# Patient Record
Sex: Female | Born: 1980 | Race: White | Hispanic: No | Marital: Married | Smoking: Former smoker
Health system: Southern US, Community
[De-identification: ages and names within clinical notes are randomized; demographics above are authoritative.]

## PROBLEM LIST (undated history)

## (undated) DIAGNOSIS — I10 Essential (primary) hypertension: Secondary | ICD-10-CM

## (undated) HISTORY — DX: Essential (primary) hypertension: I10

---

## 2003-03-18 HISTORY — PX: TUBAL LIGATION: SHX77

## 2019-07-04 ENCOUNTER — Ambulatory Visit: Payer: Self-pay | Attending: Internal Medicine

## 2019-07-04 ENCOUNTER — Other Ambulatory Visit: Payer: Self-pay

## 2019-07-04 DIAGNOSIS — Z20822 Contact with and (suspected) exposure to covid-19: Secondary | ICD-10-CM

## 2019-07-04 DIAGNOSIS — U071 COVID-19: Secondary | ICD-10-CM | POA: Insufficient documentation

## 2019-07-05 LAB — SARS-COV-2, NAA 2 DAY TAT

## 2019-07-05 LAB — NOVEL CORONAVIRUS, NAA: SARS-CoV-2, NAA: DETECTED — AB

## 2020-04-30 ENCOUNTER — Encounter: Payer: Self-pay | Admitting: Family Medicine

## 2020-04-30 NOTE — Progress Notes (Signed)
  Mom called in under her name, as she couldn't get her son's chart to pull up right. Advised to call his Peds office for assistant in Mychart reset.  So the appt could be under his name and chart.   She verbalized understanding  Error appt/ no charge   This encounter was created in error - please disregard.

## 2020-08-16 ENCOUNTER — Encounter: Payer: Self-pay | Admitting: Emergency Medicine

## 2020-08-16 ENCOUNTER — Ambulatory Visit
Admission: EM | Admit: 2020-08-16 | Discharge: 2020-08-16 | Disposition: A | Discharge status: Home / Self Care | Payer: Managed Care, Other (non HMO) | Attending: Internal Medicine | Admitting: Internal Medicine

## 2020-08-16 ENCOUNTER — Other Ambulatory Visit: Payer: Self-pay

## 2020-08-16 DIAGNOSIS — R5383 Other fatigue: Secondary | ICD-10-CM | POA: Insufficient documentation

## 2020-08-16 DIAGNOSIS — R635 Abnormal weight gain: Secondary | ICD-10-CM | POA: Insufficient documentation

## 2020-08-16 DIAGNOSIS — I10 Essential (primary) hypertension: Secondary | ICD-10-CM | POA: Diagnosis not present

## 2020-08-16 LAB — POCT URINALYSIS DIP (MANUAL ENTRY)
Bilirubin, UA: NEGATIVE
Glucose, UA: NEGATIVE mg/dL
Ketones, POC UA: NEGATIVE mg/dL
Leukocytes, UA: NEGATIVE
Nitrite, UA: POSITIVE — AB
Protein Ur, POC: NEGATIVE mg/dL
Spec Grav, UA: 1.01
Urobilinogen, UA: 0.2 U/dL
pH, UA: 6

## 2020-08-16 MED ORDER — AMLODIPINE BESYLATE 2.5 MG PO TABS: 2.5000 mg | ORAL_TABLET | Freq: Every day | ORAL | 0 refills | Status: DC

## 2020-08-16 NOTE — ED Provider Notes (Signed)
[facility]    CSN: [id number] Arrival date & time: 08/16/20  1831      History   Chief Complaint No chief complaint on file.   HPI Becky Bennett is a 40 y.o. female with elevated BP at work. The readings have been as follows.  5/31 175/105 6/1 154/99 6/2 153/108 Her BP's have been around 140's/100 prior to this week Has been having HA's Has not been seen by a PCP since 2016.Is awaiting for a call to get established with one soon.  Has gained 30 lbs in the past 1 year.  She snores sometimes  History reviewed. No pertinent past medical history.  There are no problems to display for this patient.   History reviewed. No pertinent surgical history.  OB History   No obstetric history on file.      Home Medications    Prior to Admission medications   Medication Sig Start Date End Date Taking? Authorizing Provider  amLODipine (NORVASC) 2.5 MG tablet Take 1 tablet (2.5 mg total) by mouth daily. 08/16/20  Yes Rodriguez-Southworth, Nettie Elm, PA-C    Family History Family History  Problem Relation Age of Onset  . Thyroid disease Mother   . Hypertension Mother   . Rheum arthritis Mother   . COPD Mother   . Hyperlipidemia Father   . Cancer Father   . Heart attack Father     Social History Social History   Tobacco Use  . Smoking status: Former Games developer  . Smokeless tobacco: Never Used  Vaping Use  . Vaping Use: Never used  Substance Use Topics  . Alcohol use: Yes    Comment: 1 glass of wine a month  . Drug use: Never     Allergies   Penicillins   Review of Systems Review of Systems  Constitutional: Positive for fatigue and unexpected weight change. Negative for appetite change.       Has been fatigued since she had covid last year   HENT: Negative for tinnitus.   Respiratory: Positive for shortness of breath. Negative for apnea and chest tightness.   Cardiovascular: Negative for chest pain and leg swelling.  Endocrine: Negative for  polydipsia and polyphagia.  Genitourinary: Negative for dysuria, flank pain, frequency and menstrual problem.       Sometimes bleeds for few weeks, and sometimes does not have one for a few months.  She has had a tubal. Gets hot flushes.  Has not had a pap since 2014 Has hx of cyst of ovaries   Musculoskeletal: Negative for gait problem.  Skin: Negative for rash.  Neurological: Positive for dizziness, light-headedness and headaches. Negative for facial asymmetry, speech difficulty and weakness.       On occipital region     Physical Exam Triage Vital Signs ED Triage Vitals  Enc Vitals Group     BP 08/16/20 1846 (!) 159/115     Pulse Rate 08/16/20 1846 90     Resp 08/16/20 1846 16     Temp 08/16/20 1846 98.8 F (37.1 C)     Temp Source 08/16/20 1846 Oral     SpO2 08/16/20 1846 98 %     Weight --      Height --      Head Circumference --      Peak Flow --      Pain Score 08/16/20 1843 3     Pain Loc --      Pain Edu? --      Excl.  in GC? --    No data found.  Updated Vital Signs BP (!) 159/115 (BP Location: Right Arm)   Pulse 90   Temp 98.8 F (37.1 C) (Oral)   Resp 16   LMP 07/11/2020   SpO2 98%   Visual Acuity Right Eye Distance:   Left Eye Distance:   Bilateral Distance:    Right Eye Near:   Left Eye Near:    Bilateral Near:     Physical Exam  Constitutional: She is oriented to person, place, and time. She appears well-developed and well-nourished. No distress.  HENT:  Head: Normocephalic and atraumatic.  Right Ear: External ear normal.  Left Ear: External ear normal.  Nose: Nose normal.  Eyes: Conjunctivae are normal. Right eye exhibits no discharge. Left eye exhibits no discharge. No scleral icterus.  Neck: Neck supple. No thyromegaly present.  No carotid bruits bilaterally  Cardiovascular: Normal rate and regular rhythm.  No murmur heard. Pulmonary/Chest: Effort normal and breath sounds normal. No respiratory distress.  Musculoskeletal: Normal  range of motion. She exhibits no edema.  Lymphadenopathy:    She has no cervical adenopathy.  Neurological: She is alert and oriented to person, place, and time.  Skin: Skin is warm and dry. Capillary refill takes less than 2 seconds. No rash noted. She is not diaphoretic.  Psychiatric: She has a normal mood and affect. Her behavior is normal. Judgment and thought content normal.  Nursing note reviewed.   UC Treatments / Results  Labs (all labs ordered are listed, but only abnormal results are displayed) Labs Reviewed  POCT URINALYSIS DIP (MANUAL ENTRY) - Abnormal; Notable for the following components:      Result Value   Color, UA colorless (*)    Blood, UA moderate (*)    Nitrite, UA Positive (*)    All other components within normal limits  URINE CULTURE  CBC WITH DIFFERENTIAL/PLATELET  COMPREHENSIVE METABOLIC PANEL  TSH    EKG NSR with non specific T wave abnormality  Radiology No results found.  Procedures Procedures (including critical care time)  Medications Ordered in UC Medications - No data to display  Initial Impression / Assessment and Plan / UC Course  I have reviewed the triage vital signs and the nursing notes. Pertinent labs & imaging results that were available during my care of the patient were reviewed by me and considered in my medical decision making (see chart for details). I ordered CBC, CMP and TSH. UA is with no proteinuria, but is positive for nitrates. She denies symptoms  I sent the urine for a culture. We will inform her if positive. I started her on Norvasc 2.5 mg qd and needs to FU with new established PCP in 2 weeks. See instructions.  Final Clinical Impressions(s) / UC Diagnoses   Final diagnoses:  Other fatigue  Weight gain  Essential hypertension     Discharge Instructions     Take a dose of the blood pressure medication tonight and then every morning when your get up. Stop processed food and high sodium foods I am sending  your urine for a culture to check for infection Continue having your blood pressure checked and write it down and should be less than 140/90. Follow up with your new provider in 2 weeks.     ED Prescriptions    Medication Sig Dispense Auth. Provider   amLODipine (NORVASC) 2.5 MG tablet Take 1 tablet (2.5 mg total) by mouth daily. 30 tablet Rodriguez-Southworth, Nettie Elm, [place]  PDMP not reviewed this encounter.   Garey Ham, Cordelia Poche 08/16/20 2040

## 2020-08-16 NOTE — ED Triage Notes (Signed)
High blood pressure for several weeks.  C/o headache. States headache is a dull constant headache.

## 2020-08-16 NOTE — Discharge Instructions (Addendum)
Take a dose of the blood pressure medication tonight and then every morning when your get up. Stop processed food and high sodium foods I am sending your urine for a culture to check for infection Continue having your blood pressure checked and write it down and should be less than 140/90. Follow up with your new provider in 2 weeks.

## 2020-08-17 LAB — CBC WITH DIFFERENTIAL/PLATELET
Basophils Absolute: 0.1 10*3/uL (ref 0.0–0.2)
Basos: 1 %
EOS (ABSOLUTE): 0.2 10*3/uL (ref 0.0–0.4)
Eos: 3 %
Hematocrit: 43.6 % (ref 34.0–46.6)
Hemoglobin: 14.4 g/dL (ref 11.1–15.9)
Immature Grans (Abs): 0 10*3/uL (ref 0.0–0.1)
Immature Granulocytes: 0 %
Lymphocytes Absolute: 3.4 10*3/uL — ABNORMAL HIGH (ref 0.7–3.1)
Lymphs: 38 %
MCH: 29 pg (ref 26.6–33.0)
MCHC: 33 g/dL (ref 31.5–35.7)
MCV: 88 fL (ref 79–97)
Monocytes Absolute: 0.5 10*3/uL (ref 0.1–0.9)
Monocytes: 6 %
Neutrophils Absolute: 4.6 10*3/uL (ref 1.4–7.0)
Neutrophils: 52 %
Platelets: 309 10*3/uL (ref 150–450)
RBC: 4.96 x10E6/uL (ref 3.77–5.28)
RDW: 12.4 % (ref 11.7–15.4)
WBC: 8.8 10*3/uL (ref 3.4–10.8)

## 2020-08-17 LAB — COMPREHENSIVE METABOLIC PANEL
ALT: 27 IU/L (ref 0–32)
AST: 37 IU/L (ref 0–40)
Albumin/Globulin Ratio: 1.6 (ref 1.2–2.2)
Albumin: 4.4 g/dL (ref 3.8–4.8)
Alkaline Phosphatase: 89 IU/L (ref 44–121)
BUN/Creatinine Ratio: 16 (ref 9–23)
BUN: 14 mg/dL (ref 6–24)
Bilirubin Total: 0.8 mg/dL (ref 0.0–1.2)
CO2: 20 mmol/L (ref 20–29)
Calcium: 9.1 mg/dL (ref 8.7–10.2)
Chloride: 102 mmol/L (ref 96–106)
Creatinine, Ser: 0.86 mg/dL (ref 0.57–1.00)
Globulin, Total: 2.7 g/dL (ref 1.5–4.5)
Glucose: 91 mg/dL (ref 65–99)
Potassium: 4 mmol/L (ref 3.5–5.2)
Sodium: 136 mmol/L (ref 134–144)
Total Protein: 7.1 g/dL (ref 6.0–8.5)
eGFR: 88 mL/min/{1.73_m2} (ref >59)

## 2020-08-17 LAB — TSH: TSH: 3.1 u[IU]/mL (ref 0.450–4.500)

## 2020-08-18 LAB — URINE CULTURE: Special Requests: NORMAL

## 2020-10-12 ENCOUNTER — Encounter: Payer: Self-pay | Admitting: Adult Health

## 2020-10-12 ENCOUNTER — Other Ambulatory Visit (HOSPITAL_COMMUNITY)
Admission: RE | Admit: 2020-10-12 | Discharge: 2020-10-12 | Disposition: A | Discharge status: Home / Self Care | Payer: Managed Care, Other (non HMO) | Source: Ambulatory Visit | Attending: Adult Health | Admitting: Adult Health

## 2020-10-12 ENCOUNTER — Ambulatory Visit: Payer: Managed Care, Other (non HMO) | Admitting: Adult Health

## 2020-10-12 ENCOUNTER — Other Ambulatory Visit: Payer: Self-pay

## 2020-10-12 VITALS — BP 133/96 | HR 80 | Ht 67.0 in | Wt 197.0 lb

## 2020-10-12 DIAGNOSIS — Z3202 Encounter for pregnancy test, result negative: Secondary | ICD-10-CM

## 2020-10-12 DIAGNOSIS — Z1231 Encounter for screening mammogram for malignant neoplasm of breast: Secondary | ICD-10-CM

## 2020-10-12 DIAGNOSIS — Z124 Encounter for screening for malignant neoplasm of cervix: Secondary | ICD-10-CM

## 2020-10-12 DIAGNOSIS — N926 Irregular menstruation, unspecified: Secondary | ICD-10-CM | POA: Diagnosis not present

## 2020-10-12 DIAGNOSIS — R232 Flushing: Secondary | ICD-10-CM | POA: Diagnosis not present

## 2020-10-12 DIAGNOSIS — R6882 Decreased libido: Secondary | ICD-10-CM | POA: Insufficient documentation

## 2020-10-12 DIAGNOSIS — R5383 Other fatigue: Secondary | ICD-10-CM

## 2020-10-12 LAB — POCT URINE PREGNANCY: Preg Test, Ur: NEGATIVE

## 2020-10-12 NOTE — Progress Notes (Signed)
  Subjective:     Patient ID: Becky Bennett, female   DOB: 1980/08/14, 40 y.o.   MRN: [id number]  HPI Becky Bennett is a 40 year old white female, married, G3P3 in for skipping periods, hot flashes and decreased libido and no energy. Last pap 9-10 years ago.  She had labs at PCP 09/04/20, HGB 14.5, TSH 1.780. PCP is Dr Margo Aye.  Review of Systems +irregular periods, will skip 3 months then have heavy period,  +hot flashes +decreased libido No energy Reviewed past medical,surgical, social and family history. Reviewed medications and allergies.     Objective:   Physical Exam BP (!) 133/96 (BP Location: Left Arm, Patient Position: Sitting, Cuff Size: Normal)   Pulse 80   Ht 5\' 7"  (1.702 m)   Wt 197 lb (89.4 kg)   LMP 10/11/2020   BMI 30.85 kg/m     UPT is negative. Skin warm and dry.Pelvic: external genitalia is normal in appearance no lesions, vagina: +period blood,urethra has no lesions or masses noted, cervix is  bulbous,has several nabothian cysts, pap with HR HPV genotyping performed , uterus: normal size, shape and contour, non tender, no masses felt, adnexa: no masses or tenderness noted. Bladder is non tender and no masses felt. Examination chaperoned by 10/13/2020 LPN. AA is 2 Fall risk is low Depression screen PHQ 2/9 10/12/2020  Decreased Interest 1  Down, Depressed, Hopeless 0  PHQ - 2 Score 1  Altered sleeping 3  Tired, decreased energy 3  Change in appetite 1  Feeling bad or failure about yourself  0  Trouble concentrating 1  Moving slowly or fidgety/restless 0  Suicidal thoughts 0  PHQ-9 Score 9   Not depressed just no energy GAD 7 : Generalized Anxiety Score 10/12/2020  Nervous, Anxious, on Edge 0  Control/stop worrying 0  Worry too much - different things 0  Trouble relaxing 1  Restless 0  Easily annoyed or irritable 1  Afraid - awful might happen 0  Total GAD 7 Score 2      Upstream - 10/12/20 1243       Pregnancy Intention Screening   Does the  patient want to become pregnant in the next year? No    Does the patient's partner want to become pregnant in the next year? No    Would the patient like to discuss contraceptive options today? No      Contraception Wrap Up   Current Method Female Sterilization    End Method Female Sterilization    Contraception Counseling Provided No             Assessment:     1. Pregnancy examination or test, negative result  - POCT urine pregnancy  2. Routine cervical smear Pap sent with HR HPV genotypinmg - Cytology - PAP( [place])  3. Irregular periods Will check FSH, will talk results back, consider low dose OCs - Follicle stimulating hormone  4. Hot flashes  - Follicle stimulating hormone  5. Decreased libido  - Follicle stimulating hormone  6. Decreased energy Increase frequency of sex - Follicle stimulating hormone  7. Screening mammogram for breast cancer Scheduled mammogram for her 10/19/20 at [facility] at 2:30 pm - MM 3D SCREEN BREAST BILATERAL; Future     Plan:     Follow up prn

## 2020-10-13 LAB — FOLLICLE STIMULATING HORMONE: FSH: 6.6 m[IU]/mL

## 2020-10-17 LAB — CYTOLOGY - PAP
Comment: NEGATIVE
Diagnosis: NEGATIVE
High risk HPV: NEGATIVE

## 2020-10-19 ENCOUNTER — Ambulatory Visit (HOSPITAL_COMMUNITY): Payer: Managed Care, Other (non HMO)

## 2020-10-26 ENCOUNTER — Other Ambulatory Visit: Payer: Self-pay

## 2020-10-26 ENCOUNTER — Ambulatory Visit (HOSPITAL_COMMUNITY)
Admission: RE | Admit: 2020-10-26 | Discharge: 2020-10-26 | Disposition: A | Discharge status: Home / Self Care | Payer: Managed Care, Other (non HMO) | Source: Ambulatory Visit | Attending: Adult Health | Admitting: Adult Health

## 2020-10-26 DIAGNOSIS — Z1231 Encounter for screening mammogram for malignant neoplasm of breast: Secondary | ICD-10-CM | POA: Insufficient documentation

## 2021-01-21 ENCOUNTER — Ambulatory Visit
Admission: EM | Admit: 2021-01-21 | Discharge: 2021-01-21 | Disposition: A | Discharge status: Home / Self Care | Payer: Managed Care, Other (non HMO) | Attending: Urgent Care | Admitting: Urgent Care

## 2021-01-21 ENCOUNTER — Other Ambulatory Visit: Payer: Self-pay

## 2021-01-21 ENCOUNTER — Encounter: Payer: Self-pay | Admitting: Emergency Medicine

## 2021-01-21 DIAGNOSIS — R070 Pain in throat: Secondary | ICD-10-CM

## 2021-01-21 DIAGNOSIS — J069 Acute upper respiratory infection, unspecified: Secondary | ICD-10-CM

## 2021-01-21 DIAGNOSIS — R052 Subacute cough: Secondary | ICD-10-CM

## 2021-01-21 DIAGNOSIS — R0981 Nasal congestion: Secondary | ICD-10-CM

## 2021-01-21 MED ORDER — FLUTICASONE PROPIONATE 50 MCG/ACT NA SUSP: 2.0000 | Freq: Every day | NASAL | 12 refills | Status: AC

## 2021-01-21 MED ORDER — PSEUDOEPHEDRINE HCL 30 MG PO TABS: 30.0000 mg | ORAL_TABLET | Freq: Three times a day (TID) | ORAL | 0 refills | Status: AC | PRN

## 2021-01-21 MED ORDER — IPRATROPIUM BROMIDE 0.03 % NA SOLN: 2.0000 | Freq: Two times a day (BID) | NASAL | 0 refills | Status: AC

## 2021-01-21 MED ORDER — PROMETHAZINE-DM 6.25-15 MG/5ML PO SYRP: 5.0000 mL | ORAL_SOLUTION | Freq: Every evening | ORAL | 0 refills | Status: AC | PRN

## 2021-01-21 MED ORDER — CETIRIZINE HCL 10 MG PO TABS: 10.0000 mg | ORAL_TABLET | Freq: Every day | ORAL | 0 refills | Status: AC

## 2021-01-21 NOTE — ED Provider Notes (Signed)
[facility]   MRN: [id number] DOB: 07-Feb-1981  Subjective:   Becky Bennett is a 40 y.o. female presenting for 4-day history of acute onset recurrent throat pain, fever, sinus congestion and coughing.  Fever has been as high as 99 F.  Has had 1 sick contact with her husband who tested negative for COVID and flu.  She does not want to be tested for this.  Denies chest pain, shortness of breath or wheezing.  She was last sick at the beginning of October and underwent a round of steroids, doxycycline.  She has a penicillin allergy but has been able to tolerate amoxicillin and Augmentin.  Does not take any allergy medications.  No current facility-administered medications for this encounter.  Current Outpatient Medications:    amLODipine (NORVASC) 5 MG tablet, Take 10 mg by mouth daily., Disp: , Rfl:    Allergies  Allergen Reactions   Penicillins     Patient has tolerated Augmentin and amoxicillin in the past.     Past Medical History:  Diagnosis Date   Hypertension      Past Surgical History:  Procedure Laterality Date   TUBAL LIGATION  2005    Family History  Problem Relation Age of Onset   Hyperlipidemia Father    Cancer Father    Heart attack Father    Thyroid disease Mother    Hypertension Mother    Rheum arthritis Mother    COPD Mother    Other Son        Shon syndrome    Social History   Tobacco Use   Smoking status: Former   Smokeless tobacco: Never  Building services engineer Use: Never used  Substance Use Topics   Alcohol use: Yes    Comment: occ   Drug use: Never    ROS   Objective:   Vitals: BP (!) 155/98   Pulse (!) 107   Temp 99 F (37.2 C) (Oral)   Resp 16   LMP 01/18/2021   SpO2 98%   Physical Exam Constitutional:      General: She is not in acute distress.    Appearance: Normal appearance. She is well-developed and normal weight. She is not ill-appearing, toxic-appearing or diaphoretic.  HENT:     Head:  Normocephalic and atraumatic.     Right Ear: Tympanic membrane and ear canal normal. No drainage, swelling or tenderness. No middle ear effusion. Tympanic membrane is not erythematous.     Left Ear: Tympanic membrane and ear canal normal. No drainage, swelling or tenderness.  No middle ear effusion. Tympanic membrane is not erythematous.     Nose: Congestion and rhinorrhea present.     Mouth/Throat:     Mouth: Mucous membranes are moist. No oral lesions.     Pharynx: No pharyngeal swelling, oropharyngeal exudate, posterior oropharyngeal erythema or uvula swelling.     Tonsils: No tonsillar exudate or tonsillar abscesses.  Eyes:     Extraocular Movements: Extraocular movements intact.     Right eye: Normal extraocular motion.     Left eye: Normal extraocular motion.     Conjunctiva/sclera: Conjunctivae normal.     Pupils: Pupils are equal, round, and reactive to light.  Cardiovascular:     Rate and Rhythm: Normal rate and regular rhythm.     Pulses: Normal pulses.     Heart sounds: Normal heart sounds. No murmur heard.   No friction rub. No gallop.  Pulmonary:     Effort: Pulmonary effort  is normal. No respiratory distress.     Breath sounds: Normal breath sounds. No stridor. No wheezing, rhonchi or rales.  Musculoskeletal:     Cervical back: Normal range of motion and neck supple.  Lymphadenopathy:     Cervical: No cervical adenopathy.  Skin:    General: Skin is warm and dry.     Findings: No rash.  Neurological:     General: No focal deficit present.     Mental Status: She is alert and oriented to person, place, and time.  Psychiatric:        Mood and Affect: Mood normal.        Behavior: Behavior normal.        Thought Content: Thought content normal.    Assessment and Plan :   PDMP not reviewed this encounter.  1. Viral upper respiratory illness   2. Sinus congestion   3. Throat pain   4. Subacute cough    Suspect a viral respiratory illness.  Discussed antibiotic  stewardship.  We will trial supportive care, recommended long-term use of Flonase and Zyrtec.  If her symptoms continue to bother her or worsen, Saturday, okay to use amoxicillin to address a sinusitis. Deferred imaging given clear cardiopulmonary exam, hemodynamically stable vital signs. Counseled patient on potential for adverse effects with medications prescribed/recommended today, ER and return-to-clinic precautions discussed, patient verbalized understanding.    Wallis Bamberg, PA-C 01/21/21 1740

## 2021-01-21 NOTE — ED Triage Notes (Signed)
PT reports cough, sore throat, fever, and congestion since Thursday. Her husband has same symptoms and was negative for flu and covid today.

## 2021-01-21 NOTE — Discharge Instructions (Signed)
If you continue to have sinus symptoms come Saturday, call this clinic and let me know. At that time, it would be appropriate to trial an antibiotic like amoxicillin for sinusitis, a bacterial sinus infection. But first try the medications I am prescribing today.

## 2022-01-23 ENCOUNTER — Other Ambulatory Visit (HOSPITAL_COMMUNITY): Payer: Self-pay | Admitting: Family Medicine

## 2022-01-23 DIAGNOSIS — Z1231 Encounter for screening mammogram for malignant neoplasm of breast: Secondary | ICD-10-CM

## 2022-01-29 ENCOUNTER — Ambulatory Visit (HOSPITAL_COMMUNITY)
Admission: RE | Admit: 2022-01-29 | Discharge: 2022-01-29 | Disposition: A | Discharge status: Home / Self Care | Payer: Managed Care, Other (non HMO) | Source: Ambulatory Visit | Attending: Family Medicine | Admitting: Family Medicine

## 2022-01-29 DIAGNOSIS — Z1231 Encounter for screening mammogram for malignant neoplasm of breast: Secondary | ICD-10-CM | POA: Diagnosis present

## 2022-08-01 IMAGING — MG MM DIGITAL SCREENING BILAT W/ TOMO AND CAD
8 series · 8 of 24 positions shown · non-contrast
Comparison: None.

ACR Breast Density Category a: The breast tissue is almost entirely
fatty.

CLINICAL DATA: Screening.

EXAM:
DIGITAL SCREENING BILATERAL MAMMOGRAM WITH TOMOSYNTHESIS AND CAD
TECHNIQUE: Bilateral screening digital craniocaudal and mediolateral oblique
mammograms were obtained. Bilateral screening digital breast
tomosynthesis was performed. The images were evaluated with
computer-aided detection.

[R MLO synth-2D]
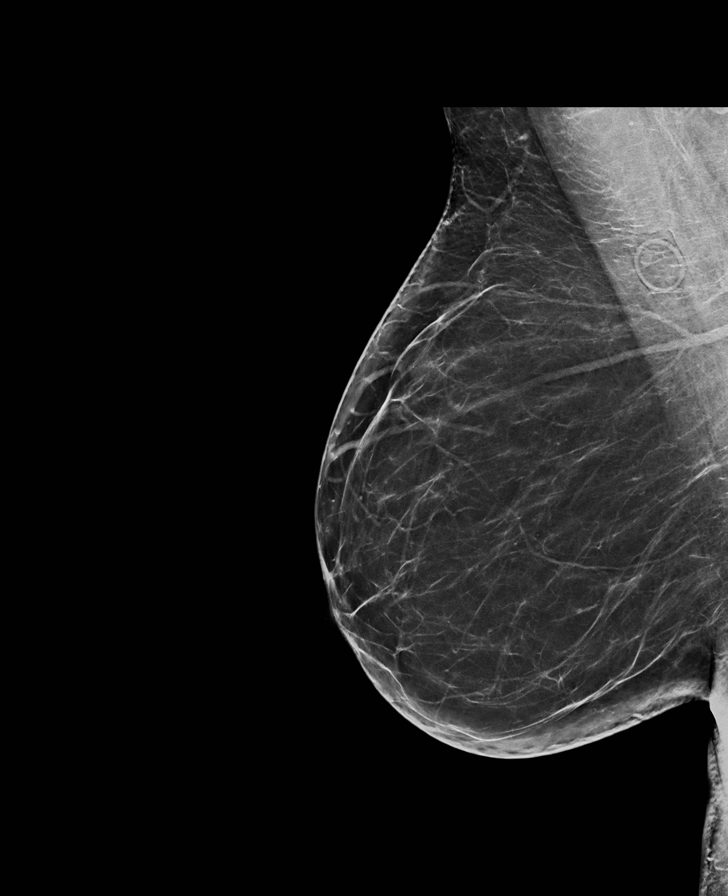

[L MLO synth-2D]
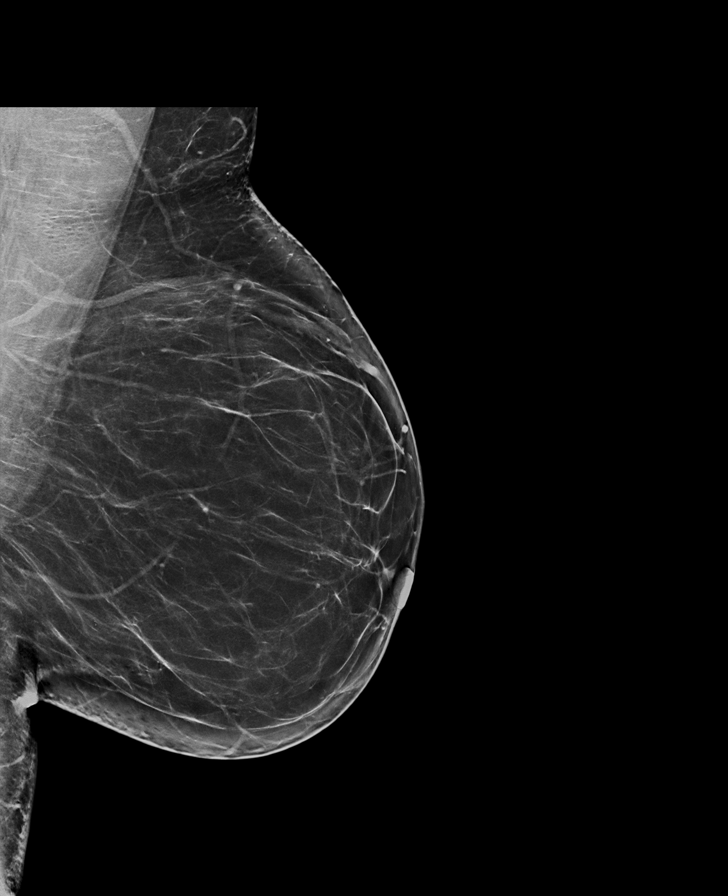

[L CC synth-2D]
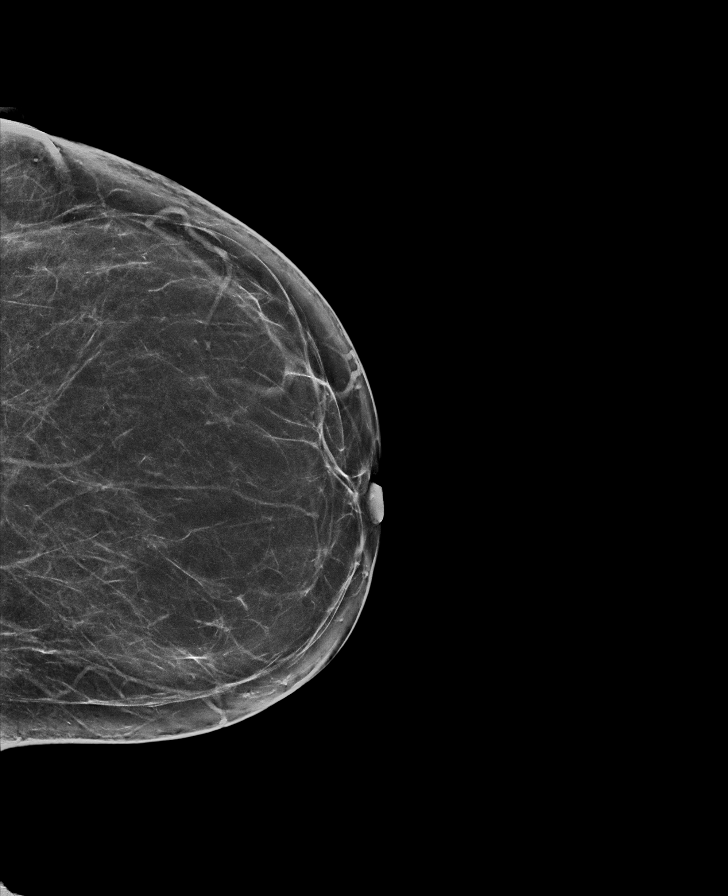

[R CC synth-2D]
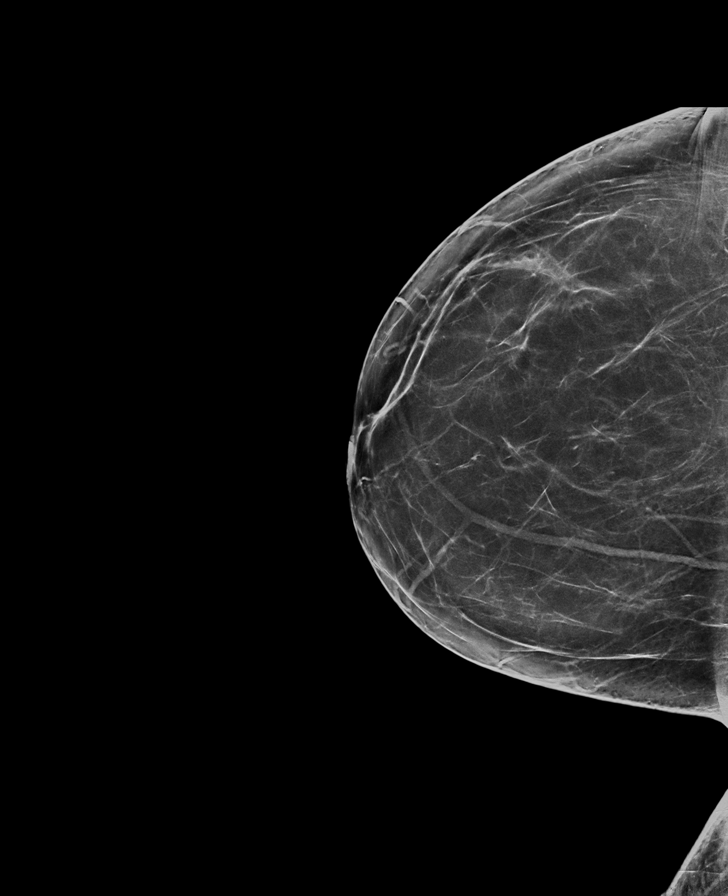

[R MLO tomo · tomo slice 39/78.0]
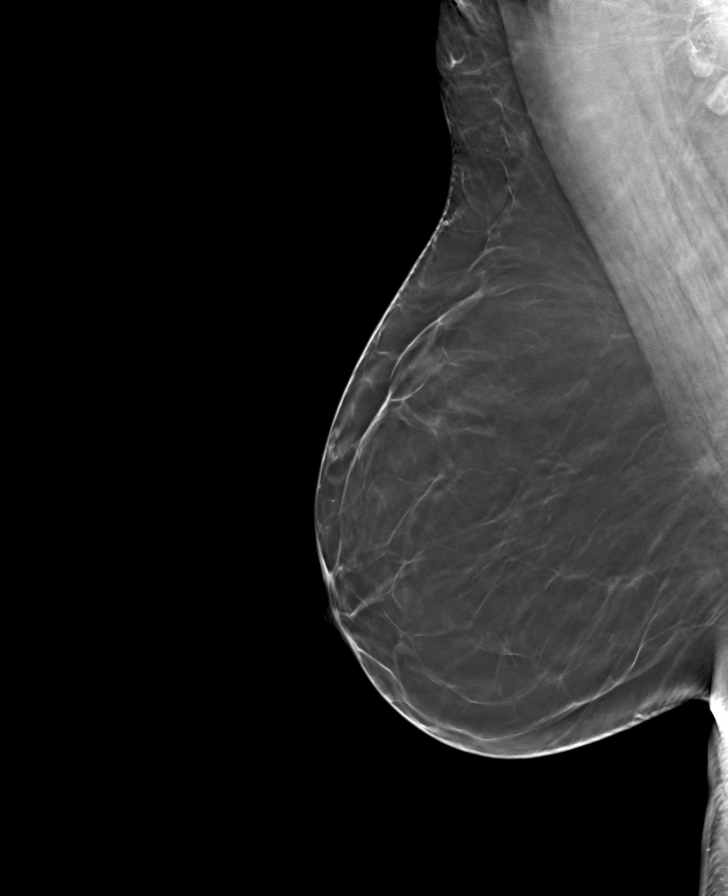

[L MLO tomo · tomo slice 41/80.0]
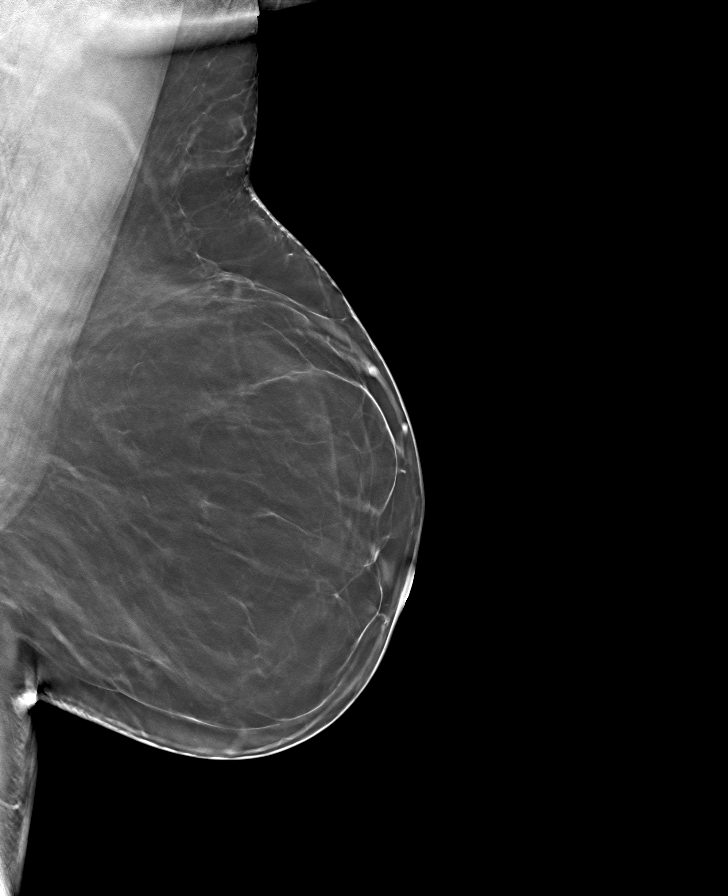

[L CC tomo · tomo slice 37/72.0]
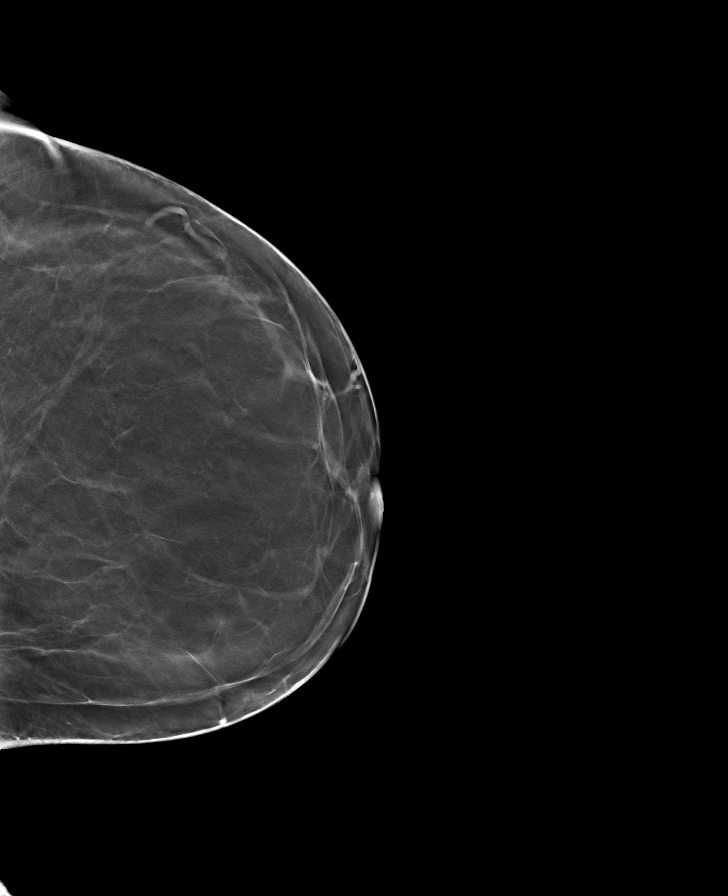

[R CC tomo · tomo slice 38/75.0]
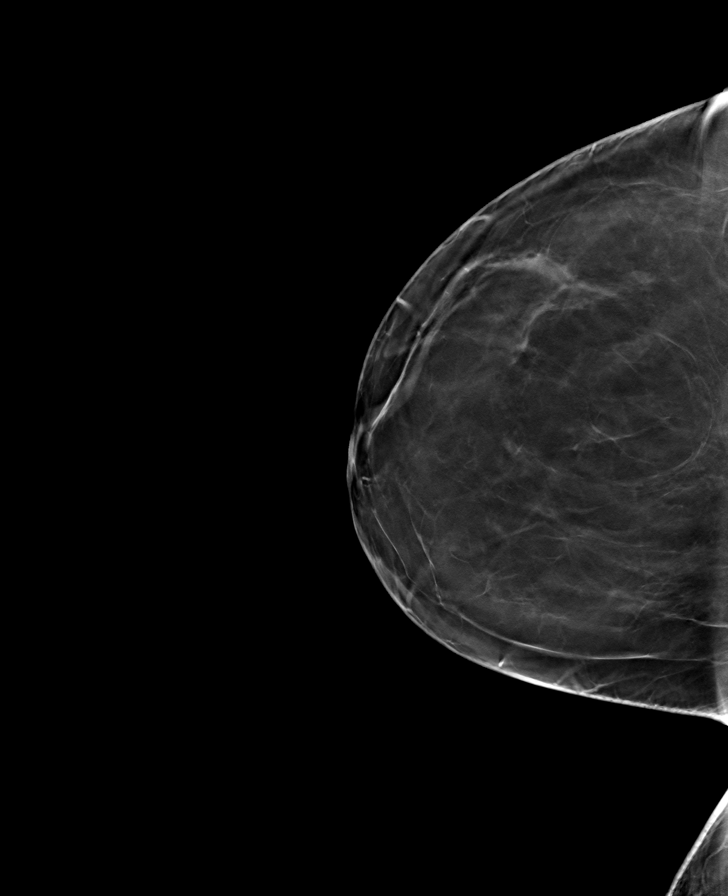

[8 of 24 positions shown; findings below may reference images not displayed]

FINDINGS: There are no findings suspicious for malignancy.
IMPRESSION: No mammographic evidence of malignancy. A result letter of this
screening mammogram will be mailed directly to the patient.

RECOMMENDATION:
Screening mammogram in one year. (Code:44-M-M6Q)

BI-RADS CATEGORY  1: Negative.
